# Patient Record
Sex: Male | Born: 1955 | Race: White | Hispanic: No | Smoking: Current every day smoker
Health system: Southern US, Community
[De-identification: ages and names within clinical notes are randomized; demographics above are authoritative.]

## PROBLEM LIST (undated history)

## (undated) DIAGNOSIS — J449 Chronic obstructive pulmonary disease, unspecified: Secondary | ICD-10-CM

## (undated) DIAGNOSIS — Z972 Presence of dental prosthetic device (complete) (partial): Secondary | ICD-10-CM

## (undated) DIAGNOSIS — W3400XA Accidental discharge from unspecified firearms or gun, initial encounter: Secondary | ICD-10-CM

## (undated) HISTORY — DX: Chronic obstructive pulmonary disease, unspecified: J44.9

## (undated) HISTORY — PX: SKIN GRAFT: SHX250

## (undated) HISTORY — DX: Accidental discharge from unspecified firearms or gun, initial encounter: W34.00XA

## (undated) HISTORY — PX: BACK SURGERY: SHX140

---

## 2008-12-17 ENCOUNTER — Ambulatory Visit: Payer: Self-pay | Admitting: Physician Assistant

## 2008-12-24 ENCOUNTER — Ambulatory Visit: Payer: Self-pay | Admitting: Unknown Physician Specialty

## 2008-12-27 ENCOUNTER — Ambulatory Visit: Payer: Self-pay | Admitting: Unknown Physician Specialty

## 2015-12-21 DIAGNOSIS — J449 Chronic obstructive pulmonary disease, unspecified: Secondary | ICD-10-CM | POA: Diagnosis not present

## 2015-12-22 ENCOUNTER — Encounter: Payer: Self-pay | Admitting: *Deleted

## 2015-12-28 ENCOUNTER — Ambulatory Visit (INDEPENDENT_AMBULATORY_CARE_PROVIDER_SITE_OTHER): Payer: PPO | Admitting: General Surgery

## 2015-12-28 ENCOUNTER — Encounter: Payer: Self-pay | Admitting: General Surgery

## 2015-12-28 VITALS — BP 150/94 | HR 74 | Resp 12 | Ht 66.0 in | Wt 164.0 lb

## 2015-12-28 DIAGNOSIS — K4090 Unilateral inguinal hernia, without obstruction or gangrene, not specified as recurrent: Secondary | ICD-10-CM | POA: Diagnosis not present
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
## 2015-12-28 NOTE — Patient Instructions (Addendum)
The patient is aware to call back for any questions or concerns. Inguinal Hernia, Adult Muscles help keep everything in the body in its proper place. But if a weak spot in the muscles develops, something can poke through. That is called a hernia. When this happens in the lower part of the belly (abdomen), it is called an inguinal hernia. (It takes its name from a part of the body in this region called the inguinal canal.) A weak spot in the wall of muscles lets some fat or part of the small intestine bulge through. An inguinal hernia can develop at any age. Men get them more often than women. CAUSES  In adults, an inguinal hernia develops over time.  It can be triggered by:  Suddenly straining the muscles of the lower abdomen.  Lifting heavy objects.  Straining to have a bowel movement. Difficult bowel movements (constipation) can lead to this.  Constant coughing. This may be caused by smoking or lung disease.  Being overweight.  Being pregnant.  Working at a job that requires long periods of standing or heavy lifting.  Having had an inguinal hernia before. One type can be an emergency situation. It is called a strangulated inguinal hernia. It develops if part of the small intestine slips through the weak spot and cannot get back into the abdomen. The blood supply can be cut off. If that happens, part of the intestine may die. This situation requires emergency surgery. SYMPTOMS  Often, a small inguinal hernia has no symptoms. It is found when a healthcare provider does a physical exam. Larger hernias usually have symptoms.   In adults, symptoms may include:  A lump in the groin. This is easier to see when the person is standing. It might disappear when lying down.  In men, a lump in the scrotum.  Pain or burning in the groin. This occurs especially when lifting, straining or coughing.  A dull ache or feeling of pressure in the groin.  Signs of a strangulated hernia can  include:  A bulge in the groin that becomes very painful and tender to the touch.  A bulge that turns red or purple.  Fever, nausea and vomiting.  Inability to have a bowel movement or to pass gas. DIAGNOSIS  To decide if you have an inguinal hernia, a healthcare provider will probably do a physical examination.  This will include asking questions about any symptoms you have noticed.  The healthcare provider might feel the groin area and ask you to cough. If an inguinal hernia is felt, the healthcare provider may try to slide it back into the abdomen.  Usually no other tests are needed. TREATMENT  Treatments can vary. The size of the hernia makes a difference. Options include:  Watchful waiting. This is often suggested if the hernia is small and you have had no symptoms.  No medical procedure will be done unless symptoms develop.  You will need to watch closely for symptoms. If any occur, contact your healthcare provider right away.  Surgery. This is used if the hernia is larger or you have symptoms.  Open surgery. This is usually an outpatient procedure (you will not stay overnight in a hospital). An cut (incision) is made through the skin in the groin. The hernia is put back inside the abdomen. The weak area in the muscles is then repaired by herniorrhaphy or hernioplasty. Herniorrhaphy: in this type of surgery, the weak muscles are sewn back together. Hernioplasty: a patch or mesh is   used to close the weak area in the abdominal wall.  Laparoscopy. In this procedure, a surgeon makes small incisions. A thin tube with a tiny video camera (called a laparoscope) is put into the abdomen. The surgeon repairs the hernia with mesh by looking with the video camera and using two long instruments. HOME CARE INSTRUCTIONS   After surgery to repair an inguinal hernia:  You will need to take pain medicine prescribed by your healthcare provider. Follow all directions carefully.  You will need  to take care of the wound from the incision.  Your activity will be restricted for awhile. This will probably include no heavy lifting for several weeks. You also should not do anything too active for a few weeks. When you can return to work will depend on the type of job that you have.  During "watchful waiting" periods, you should:  Maintain a healthy weight.  Eat a diet high in fiber (fruits, vegetables and whole grains).  Drink plenty of fluids to avoid constipation. This means drinking enough water and other liquids to keep your urine clear or pale yellow.  Do not lift heavy objects.  Do not stand for long periods of time.  Quit smoking. This should keep you from developing a frequent cough. SEEK MEDICAL CARE IF:   A bulge develops in your groin area.  You feel pain, a burning sensation or pressure in the groin. This might be worse if you are lifting or straining.  You develop a fever of more than 100.5 F (38.1 C). SEEK IMMEDIATE MEDICAL CARE IF:   Pain in the groin increases suddenly.  A bulge in the groin gets bigger suddenly and does not go down.  For men, there is sudden pain in the scrotum. Or, the size of the scrotum increases.  A bulge in the groin area becomes red or purple and is painful to touch.  You have nausea or vomiting that does not go away.  You feel your heart beating much faster than normal.  You cannot have a bowel movement or pass gas.  You develop a fever of more than 102.0 F (38.9 C).   This information is not intended to replace advice given to you by your health care provider. Make sure you discuss any questions you have with your health care provider.   Document Released: 03/03/2009 Document Revised: 01/07/2012 Document Reviewed: 04/18/2015 Elsevier Interactive Patient Education 2016 [employer].  Patient's surgery has been scheduled for 01-23-16 at [facility].

## 2015-12-29 ENCOUNTER — Encounter: Payer: Self-pay | Admitting: General Surgery

## 2016-01-16 ENCOUNTER — Inpatient Hospital Stay: Admission: RE | Admit: 2016-01-16 | Payer: Self-pay | Source: Ambulatory Visit

## 2016-01-16 ENCOUNTER — Encounter: Payer: Self-pay | Admitting: *Deleted

## 2016-01-16 NOTE — Patient Instructions (Signed)
  Your procedure is scheduled on: 01/23/16 Report to Day Surgery. [facility] SECOND FLOOR To find out your arrival time please call [phone] between 1PM - 3PM on 01/20/16  Remember: Instructions that are not followed completely may result in serious medical risk, up to and including death, or upon the discretion of your surgeon and anesthesiologist your surgery may need to be rescheduled.    _X___ 1. Do not eat food or drink liquids after midnight. No gum chewing or hard candies.     __X__ 2. No Alcohol for 24 hours before or after surgery.   ____ 3. Bring all medications with you on the day of surgery if instructed.    _X___ 4. Notify your doctor if there is any change in your medical condition     (cold, fever, infections).     Do not wear jewelry, make-up, hairpins, clips or nail polish.  Do not wear lotions, powders, or perfumes. You may wear deodorant.  Do not shave 48 hours prior to surgery. Men may shave face and neck.  Do not bring valuables to the hospital.    [facility] is not responsible for any belongings or valuables.               Contacts, dentures or bridgework may not be worn into surgery.  Leave your suitcase in the car. After surgery it may be brought to your room.  For patients admitted to the hospital, discharge time is determined by your                treatment team.   Patients discharged the day of surgery will not be allowed to drive home.   Please read over the following fact sheets that you were given:   Surgical Site Infection Prevention   ____ Take these medicines the morning of surgery with A SIP OF WATER:    1.  2.   3.   4.  5.  6.  ____ Fleet Enema (as directed)   ____ Use CHG Soap as directed  _X_ Use inhalers on the day of surgery  ____ Stop metformin 2 days prior to surgery    ____ Take 1/2 of usual insulin dose the night before surgery and none on the morning of surgery.   ____ Stop Coumadin/Plavix/aspirin on   _X___ Stop  Anti-inflammatories on   USE TYLENOL INSTREAD OG ADVIL UNTIL AFTER SURGERY  ____ Stop supplements until after surgery.    ____ Bring C-Pap to the hospital.

## 2016-01-23 ENCOUNTER — Ambulatory Visit
Admission: RE | Admit: 2016-01-23 | Discharge: 2016-01-23 | Disposition: A | Discharge status: Home / Self Care | Payer: PPO | Source: Ambulatory Visit | Attending: General Surgery | Admitting: General Surgery

## 2016-01-23 ENCOUNTER — Encounter
Admission: RE | Disposition: A | Discharge status: Home / Self Care | Payer: Self-pay | Source: Ambulatory Visit | Attending: General Surgery

## 2016-01-23 ENCOUNTER — Ambulatory Visit: Payer: PPO | Admitting: Anesthesiology

## 2016-01-23 ENCOUNTER — Encounter: Payer: Self-pay | Admitting: *Deleted

## 2016-01-23 DIAGNOSIS — K4090 Unilateral inguinal hernia, without obstruction or gangrene, not specified as recurrent: Secondary | ICD-10-CM | POA: Insufficient documentation

## 2016-01-23 DIAGNOSIS — J449 Chronic obstructive pulmonary disease, unspecified: Secondary | ICD-10-CM | POA: Insufficient documentation

## 2016-01-23 DIAGNOSIS — Z801 Family history of malignant neoplasm of trachea, bronchus and lung: Secondary | ICD-10-CM | POA: Diagnosis not present

## 2016-01-23 DIAGNOSIS — R05 Cough: Secondary | ICD-10-CM | POA: Insufficient documentation

## 2016-01-23 DIAGNOSIS — F17210 Nicotine dependence, cigarettes, uncomplicated: Secondary | ICD-10-CM | POA: Diagnosis not present

## 2016-01-23 DIAGNOSIS — Z7951 Long term (current) use of inhaled steroids: Secondary | ICD-10-CM | POA: Diagnosis not present

## 2016-01-23 DIAGNOSIS — Z800 Family history of malignant neoplasm of digestive organs: Secondary | ICD-10-CM | POA: Diagnosis not present

## 2016-01-23 HISTORY — PX: INGUINAL HERNIA REPAIR: SHX194

## 2016-01-23 SURGERY — REPAIR, HERNIA, INGUINAL, ADULT
Anesthesia: General | Laterality: Left | Wound class: Clean

## 2016-01-23 MED ORDER — ACETAMINOPHEN 10 MG/ML IV SOLN
INTRAVENOUS | Status: DC | PRN
  Administered 2016-01-23: 1000 mg via INTRAVENOUS

## 2016-01-23 MED ORDER — BUPIVACAINE HCL (PF) 0.5 % IJ SOLN
INTRAMUSCULAR | Status: AC
  Filled 2016-01-23: qty 30

## 2016-01-23 MED ORDER — CHLORHEXIDINE GLUCONATE 4 % EX LIQD: 1.0000 "application " | Freq: Once | CUTANEOUS | Status: DC

## 2016-01-23 MED ORDER — LIDOCAINE HCL 1 % IJ SOLN
INTRAMUSCULAR | Status: DC | PRN
  Administered 2016-01-23: 30 mL via SUBCUTANEOUS

## 2016-01-23 MED ORDER — PROPOFOL 10 MG/ML IV BOLUS
INTRAVENOUS | Status: DC | PRN
  Administered 2016-01-23: 20 mg via INTRAVENOUS

## 2016-01-23 MED ORDER — PROPOFOL 500 MG/50ML IV EMUL
INTRAVENOUS | Status: DC | PRN
  Administered 2016-01-23: 100 ug/kg/min via INTRAVENOUS

## 2016-01-23 MED ORDER — HYDROCODONE-ACETAMINOPHEN 5-325 MG PO TABS: 1.0000 | ORAL_TABLET | Freq: Four times a day (QID) | ORAL | Status: DC | PRN

## 2016-01-23 MED ORDER — ONDANSETRON HCL 4 MG/2ML IJ SOLN: 4.0000 mg | Freq: Once | INTRAMUSCULAR | Status: DC | PRN

## 2016-01-23 MED ORDER — MIDAZOLAM HCL 2 MG/2ML IJ SOLN
INTRAMUSCULAR | Status: DC | PRN
Start: 2016-01-23 — End: 2016-01-23
  Administered 2016-01-23 (×2): 2 mg via INTRAVENOUS

## 2016-01-23 MED ORDER — CEFAZOLIN SODIUM-DEXTROSE 2-3 GM-% IV SOLR
INTRAVENOUS | Status: AC
  Administered 2016-01-23: 2000 mg
  Filled 2016-01-23: qty 50

## 2016-01-23 MED ORDER — LIDOCAINE HCL (PF) 1 % IJ SOLN
INTRAMUSCULAR | Status: AC
  Filled 2016-01-23: qty 30

## 2016-01-23 MED ORDER — ONDANSETRON HCL 4 MG/2ML IJ SOLN
INTRAMUSCULAR | Status: DC | PRN
Start: 2016-01-23 — End: 2016-01-23
  Administered 2016-01-23: 4 mg via INTRAVENOUS

## 2016-01-23 MED ORDER — LACTATED RINGERS IV SOLN
INTRAVENOUS | Status: DC
  Administered 2016-01-23: 06:00:00 via INTRAVENOUS

## 2016-01-23 MED ORDER — FAMOTIDINE 20 MG PO TABS
20.0000 mg | ORAL_TABLET | Freq: Once | ORAL | Status: AC
  Administered 2016-01-23: 20 mg via ORAL

## 2016-01-23 MED ORDER — OXYCODONE-ACETAMINOPHEN 5-325 MG PO TABS: 1.0000 | ORAL_TABLET | ORAL | Status: DC | PRN

## 2016-01-23 MED ORDER — CEFAZOLIN SODIUM-DEXTROSE 2-4 GM/100ML-% IV SOLN
2.0000 g | INTRAVENOUS | Status: DC
  Filled 2016-01-23: qty 100

## 2016-01-23 MED ORDER — FENTANYL CITRATE (PF) 100 MCG/2ML IJ SOLN
INTRAMUSCULAR | Status: DC | PRN
  Administered 2016-01-23 (×2): 50 ug via INTRAVENOUS

## 2016-01-23 MED ORDER — ACETAMINOPHEN 10 MG/ML IV SOLN
INTRAVENOUS | Status: AC
  Filled 2016-01-23: qty 100

## 2016-01-23 MED ORDER — FAMOTIDINE 20 MG PO TABS
ORAL_TABLET | ORAL | Status: AC
  Administered 2016-01-23: 20 mg via ORAL
  Filled 2016-01-23: qty 1

## 2016-01-23 MED ORDER — FENTANYL CITRATE (PF) 100 MCG/2ML IJ SOLN: 25.0000 ug | INTRAMUSCULAR | Status: DC | PRN

## 2016-01-23 SURGICAL SUPPLY — 29 items
BLADE SURG 15 STRL SS SAFETY (BLADE) ×3 IMPLANT
CANISTER SUCT 1200ML W/VALVE (MISCELLANEOUS) ×3 IMPLANT
CHLORAPREP W/TINT 26ML (MISCELLANEOUS) ×3 IMPLANT
DECANTER SPIKE VIAL GLASS SM (MISCELLANEOUS) IMPLANT
DRAIN PENROSE 1/4X12 LTX (DRAIN) ×3 IMPLANT
DRAPE LAPAROTOMY 100X77 ABD (DRAPES) ×3 IMPLANT
ELECT REM PT RETURN 9FT ADLT (ELECTROSURGICAL) ×3
ELECTRODE REM PT RTRN 9FT ADLT (ELECTROSURGICAL) ×1 IMPLANT
GLOVE BIO SURGEON STRL SZ7 (GLOVE) ×9 IMPLANT
GOWN STRL REUS W/ TWL LRG LVL3 (GOWN DISPOSABLE) ×2 IMPLANT
GOWN STRL REUS W/TWL LRG LVL3 (GOWN DISPOSABLE) ×4
KIT RM TURNOVER STRD PROC AR (KITS) ×3 IMPLANT
LABEL OR SOLS (LABEL) ×3 IMPLANT
LIQUID BAND (GAUZE/BANDAGES/DRESSINGS) ×3 IMPLANT
MESH PARIETEX PROGRIP LEFT (Mesh General) ×3 IMPLANT
NDL HPO THNWL 1X22GA REG BVL (NEEDLE) ×1 IMPLANT
NEEDLE HYPO 25X1 1.5 SAFETY (NEEDLE) ×3 IMPLANT
NEEDLE SAFETY 22GX1 (NEEDLE) ×2
NS IRRIG 500ML POUR BTL (IV SOLUTION) ×3 IMPLANT
PACK BASIN MINOR ARMC (MISCELLANEOUS) ×3 IMPLANT
SUT PDS 2-0 27IN (SUTURE) ×3 IMPLANT
SUT VIC AB 2-0 SH 27 (SUTURE) ×2
SUT VIC AB 2-0 SH 27XBRD (SUTURE) ×1 IMPLANT
SUT VIC AB 3-0 54X BRD REEL (SUTURE) ×1 IMPLANT
SUT VIC AB 3-0 BRD 54 (SUTURE) ×2
SUT VIC AB 3-0 SH 27 (SUTURE) ×2
SUT VIC AB 3-0 SH 27X BRD (SUTURE) ×1 IMPLANT
SUT VIC AB 4-0 FS2 27 (SUTURE) ×3 IMPLANT
SYR CONTROL 10ML (SYRINGE) ×3 IMPLANT

## 2016-01-23 NOTE — Anesthesia Preprocedure Evaluation (Signed)
Anesthesia Evaluation  Patient identified by MRN, date of birth, ID band Patient awake    Reviewed: Allergy & Precautions, NPO status , Patient's Chart, lab work & pertinent test results  Airway Mallampati: III  TM Distance: >3 FB Neck ROM: Limited    Dental   Pulmonary COPD,  COPD inhaler, Current Smoker,    Pulmonary exam normal breath sounds clear to auscultation       Cardiovascular negative cardio ROS Normal cardiovascular exam     Neuro/Psych negative neurological ROS  negative psych ROS   GI/Hepatic negative GI ROS, Neg liver ROS,   Endo/Other  negative endocrine ROS  Renal/GU negative Renal ROS  negative genitourinary   Musculoskeletal negative musculoskeletal ROS (+)   Abdominal Normal abdominal exam  (+)   Peds negative pediatric ROS (+)  Hematology negative hematology ROS (+)   Anesthesia Other Findings   Reproductive/Obstetrics                             Anesthesia Physical Anesthesia Plan  ASA: III  Anesthesia Plan: General   Post-op Pain Management:    Induction: Intravenous  Airway Management Planned:   Additional Equipment:   Intra-op Plan:   Post-operative Plan:   Informed Consent: I have reviewed the patients History and Physical, chart, labs and discussed the procedure including the risks, benefits and alternatives for the proposed anesthesia with the patient or authorized representative who has indicated his/her understanding and acceptance.   Dental advisory given  Plan Discussed with: CRNA and Surgeon  Anesthesia Plan Comments:         Anesthesia Quick Evaluation
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
## 2016-01-23 NOTE — Interval H&P Note (Signed)
History and Physical Interval Note:  01/23/2016 7:07 AM  Daniel Larson  has presented today for surgery, with the diagnosis of LEFT INGUINAL HERNIA  The various methods of treatment have been discussed with the patient and family. After consideration of risks, benefits and other options for treatment, the patient has consented to  Procedure(s): HERNIA REPAIR INGUINAL ADULT (Left) as a surgical intervention .  The patient's history has been reviewed, patient examined, no change in status, stable for surgery.  I have reviewed the patient's chart and labs.  Questions were answered to the patient's satisfaction.     Gregory Dowe G

## 2016-01-23 NOTE — Op Note (Signed)
Preop diagnosis: Left inguinal hernia  Post op diagnosis: Same, direct type  Operation: Repair left inguinal hernia with mesh  Surgeon: S.G.Ezio Wieck  Assistant:     Anesthesia: Monitored anesthesia care. Local anesthetic containing 0.5% Marcaine and 1% Xylocaine total of 30 mL  Complications: None  EBL: Minimal  Drains: None  Description: Patient was placed the supine position the operating table. With adequate sedation and monitoring the left groin was prepped and draped sterile field and timeout performed. Local anesthetic was instilled along the inguinal canal area to obtain an adequate block. Skin incision made along the medial two thirds of the inguinal canal and deepened through the layers down to the external oblique. Bleeding was controlled with cautery. External oblique was opened along the line of its fibers. Exploration revealed the patient had a fairly sizable direct hernia lying medial to the cord which was fairly narrow. The hernia was freed from the cord in the posterior wall was well delineated. The ilioinguinal nerve was identified and preserved. The hernia was pushed back in easily and plicated with a running suture of 2-0 Vicryl. The repair was achieved with the use of Progrip mesh. The mesh was placed around the cord and laid down to cover the posterior wall completely and the lateral ends tucked underneath the external oblique. Medial and was tacked to the pubic tubercle with a single 2-0 PDS stitch. The external oblique was then closed with the running 2-0 PDS stitch. Subcutaneous tissue closed with 3-0 Vicryl. Skin closed with subcuticular 4-0 Vicryl and liqui  ban applied. Procedure was well-tolerated with no immediate problems encountered. Patient was returned recovery room stable condition

## 2016-01-23 NOTE — Transfer of Care (Signed)
Immediate Anesthesia Transfer of Care Note  Patient: Daniel Larson  Procedure(s) Performed: Procedure(s): HERNIA REPAIR INGUINAL ADULT (Left)  Patient Location: PACU  Anesthesia Type:General  Level of Consciousness: sedated  Airway & Oxygen Therapy: Patient Spontanous Breathing and Patient connected to face mask oxygen  Post-op Assessment: Report given to RN and Post -op Vital signs reviewed and stable  Post vital signs: Reviewed and stable  Last Vitals:  Filed Vitals:   01/23/16 0605 01/23/16 0824  BP: 129/77 106/64  Pulse: 83 70  Temp: 36.5 C 36.4 C  Resp: 16 11    Complications: No apparent anesthesia complications

## 2016-01-23 NOTE — Discharge Instructions (Signed)
Inguinal Hernia, Adult °Muscles help keep everything in the body in its proper place. But if a weak spot in the muscles develops, something can poke through. That is called a hernia. When this happens in the lower part of the belly (abdomen), it is called an inguinal hernia. (It takes its name from a part of the body in this region called the inguinal canal.) A weak spot in the wall of muscles lets some fat or part of the small intestine bulge through. An inguinal hernia can develop at any age. Men get them more often than women. °CAUSES  °In adults, an inguinal hernia develops over time. °· It can be triggered by: °¨ Suddenly straining the muscles of the lower abdomen. °¨ Lifting heavy objects. °¨ Straining to have a bowel movement. Difficult bowel movements (constipation) can lead to this. °¨ Constant coughing. This may be caused by smoking or lung disease. °¨ Being overweight. °¨ Being pregnant. °¨ Working at a job that requires long periods of standing or heavy lifting. °¨ Having had an inguinal hernia before. °One type can be an emergency situation. It is called a strangulated inguinal hernia. It develops if part of the small intestine slips through the weak spot and cannot get back into the abdomen. The blood supply can be cut off. If that happens, part of the intestine may die. This situation requires emergency surgery. °SYMPTOMS  °Often, a small inguinal hernia has no symptoms. It is found when a healthcare provider does a physical exam. Larger hernias usually have symptoms.  °· In adults, symptoms may include: °¨ A lump in the groin. This is easier to see when the person is standing. It might disappear when lying down. °¨ In men, a lump in the scrotum. °¨ Pain or burning in the groin. This occurs especially when lifting, straining or coughing. °¨ A dull ache or feeling of pressure in the groin. °· Signs of a strangulated hernia can include: °¨ A bulge in the groin that becomes very painful and tender to the  touch. °¨ A bulge that turns red or purple. °¨ Fever, nausea and vomiting. °¨ Inability to have a bowel movement or to pass gas. °DIAGNOSIS  °To decide if you have an inguinal hernia, a healthcare provider will probably do a physical examination. °· This will include asking questions about any symptoms you have noticed. °· The healthcare provider might feel the groin area and ask you to cough. If an inguinal hernia is felt, the healthcare provider may try to slide it back into the abdomen. °· Usually no other tests are needed. °TREATMENT  °Treatments can vary. The size of the hernia makes a difference. Options include: °· Watchful waiting. This is often suggested if the hernia is small and you have had no symptoms. °¨ No medical procedure will be done unless symptoms develop. °¨ You will need to watch closely for symptoms. If any occur, contact your healthcare provider right away. °· Surgery. This is used if the hernia is larger or you have symptoms. °¨ Open surgery. This is usually an outpatient procedure (you will not stay overnight in a hospital). An cut (incision) is made through the skin in the groin. The hernia is put back inside the abdomen. The weak area in the muscles is then repaired by herniorrhaphy or hernioplasty. Herniorrhaphy: in this type of surgery, the weak muscles are sewn back together. Hernioplasty: a patch or mesh is used to close the weak area in the abdominal wall. °¨ Laparoscopy.   In this procedure, a surgeon makes small incisions. A thin tube with a tiny video camera (called a laparoscope) is put into the abdomen. The surgeon repairs the hernia with mesh by looking with the video camera and using two long instruments. HOME CARE INSTRUCTIONS   After surgery to repair an inguinal hernia:  You will need to take pain medicine prescribed by your healthcare provider. Follow all directions carefully.  You will need to take care of the wound from the incision.  Your activity will be  restricted for awhile. This will probably include no heavy lifting for several weeks. You also should not do anything too active for a few weeks. When you can return to work will depend on the type of job that you have.  During "watchful waiting" periods, you should:  Maintain a healthy weight.  Eat a diet high in fiber (fruits, vegetables and whole grains).  Drink plenty of fluids to avoid constipation. This means drinking enough water and other liquids to keep your urine clear or pale yellow.  Do not lift heavy objects.  Do not stand for long periods of time.  Quit smoking. This should keep you from developing a frequent cough. SEEK MEDICAL CARE IF:   A bulge develops in your groin area.  You feel pain, a burning sensation or pressure in the groin. This might be worse if you are lifting or straining.  You develop a fever of more than 100.5 F (38.1 C). SEEK IMMEDIATE MEDICAL CARE IF:   Pain in the groin increases suddenly.  A bulge in the groin gets bigger suddenly and does not go down.  For men, there is sudden pain in the scrotum. Or, the size of the scrotum increases.  A bulge in the groin area becomes red or purple and is painful to touch.  You have nausea or vomiting that does not go away.  You feel your heart beating much faster than normal.  You cannot have a bowel movement or pass gas.  You develop a fever of more than 102.0 F (38.9 C).   This information is not intended to replace advice given to you by your health care provider. Make sure you discuss any questions you have with your health care provider.   Document Released: 03/03/2009 Document Revised: 01/07/2012 Document Reviewed: 04/18/2015 Elsevier Interactive Patient Education 2016 [place]   1) The drugs that you were given will stay in your system until tomorrow so for the next 24 hours you should not:  A) Drive an automobile B) Make any legal  decisions C) Drink any alcoholic beverage   2) You may resume regular meals tomorrow.  Today it is better to start with liquids and gradually work up to solid foods.  You may eat anything you prefer, but it is better to start with liquids, then soup and crackers, and gradually work up to solid foods.   3) Please notify your doctor immediately if you have any unusual bleeding, trouble breathing, redness and pain at the surgery site, drainage, fever, or pain not relieved by medication.    4) Additional Instructions:        Please contact your physician with any problems or Same Day Surgery at [phone], Monday through Friday 6 am to 4 pm, or [place] at [facility] number at [phone].

## 2016-01-24 NOTE — Anesthesia Postprocedure Evaluation (Signed)
Anesthesia Post Note  Patient: Daniel Larson  Procedure(s) Performed: Procedure(s) (LRB): HERNIA REPAIR INGUINAL ADULT (Left)  Patient location during evaluation: PACU Anesthesia Type: General Level of consciousness: awake and alert and oriented Pain management: pain level controlled Vital Signs Assessment: post-procedure vital signs reviewed and stable Respiratory status: spontaneous breathing Cardiovascular status: blood pressure returned to baseline Anesthetic complications: no    Last Vitals:  Filed Vitals:   01/23/16 0915 01/23/16 0950  BP: 126/84 128/86  Pulse: 72 74  Temp:    Resp: 18 18    Last Pain:  Filed Vitals:   01/23/16 0953  PainSc: Asleep                 Alexavier Tsutsui

## 2016-01-30 ENCOUNTER — Ambulatory Visit (INDEPENDENT_AMBULATORY_CARE_PROVIDER_SITE_OTHER): Payer: PPO | Admitting: General Surgery

## 2016-01-30 ENCOUNTER — Encounter: Payer: Self-pay | Admitting: General Surgery

## 2016-01-30 VITALS — BP 124/68 | HR 90 | Resp 14 | Ht 67.0 in | Wt 161.0 lb

## 2016-01-30 DIAGNOSIS — K4090 Unilateral inguinal hernia, without obstruction or gangrene, not specified as recurrent: Secondary | ICD-10-CM

## 2016-01-30 NOTE — Patient Instructions (Addendum)
Call with any concerns or questions. Return in 4 weeks, may start driving in one week.  Increase activity as tolerated.

## 2016-01-30 NOTE — Progress Notes (Signed)
Patient ID: Daniel Larson, male   DOB: Mar 22, 1956, 60 y.o.   MRN: [id number] Here today for a one week post op visit for left inguinal hernia  repair done on 01/23/16. Patient states he is doing well. He has some swelling but it is much better. He is moving his bowels regularly and urinating normal.  I have reviewed the history of present illness with the patient.  Well healed incision, no hernia noted. Abdomen is soft, good bowel sounds Advised to use heating pad to help with the pain and swelling.   Return in 4-5 weeks. Wait one week to start driving. Increase activity as tolerated.  This information has been scribed by Verlene Mayer, CMA   PCP: Dr. Dion Body

## 2016-02-01 ENCOUNTER — Encounter: Payer: Self-pay | Admitting: General Surgery

## 2016-02-27 ENCOUNTER — Encounter: Payer: Self-pay | Admitting: General Surgery

## 2016-02-27 ENCOUNTER — Ambulatory Visit (INDEPENDENT_AMBULATORY_CARE_PROVIDER_SITE_OTHER): Payer: PPO | Admitting: General Surgery

## 2016-02-27 VITALS — BP 130/70 | HR 76 | Resp 14 | Ht 67.0 in | Wt 167.0 lb

## 2016-02-27 DIAGNOSIS — K4090 Unilateral inguinal hernia, without obstruction or gangrene, not specified as recurrent: Secondary | ICD-10-CM

## 2016-02-27 NOTE — Patient Instructions (Signed)
Patient to return as needed. 

## 2016-02-27 NOTE — Progress Notes (Signed)
Here today for 6 week post op visit for left inguinal hernia repair done on 01/23/16. Patient states he is doing well. He has some swelling but it is much better. He is moving his bowels regularly and urinating normal.  I have reviewed the history of present illness with the patient.  Left inguinal incision is well healed. Repair is intact. Abdomen is soft. May return to normal activity and follow up as needed.  PCP:  Dion Body This information has been scribed by Gaspar Cola CMA.

## 2017-07-24 DIAGNOSIS — M9903 Segmental and somatic dysfunction of lumbar region: Secondary | ICD-10-CM | POA: Diagnosis not present

## 2017-07-24 DIAGNOSIS — G5721 Lesion of femoral nerve, right lower limb: Secondary | ICD-10-CM | POA: Diagnosis not present

## 2017-07-26 DIAGNOSIS — G5721 Lesion of femoral nerve, right lower limb: Secondary | ICD-10-CM | POA: Diagnosis not present

## 2017-07-26 DIAGNOSIS — M9903 Segmental and somatic dysfunction of lumbar region: Secondary | ICD-10-CM | POA: Diagnosis not present

## 2017-08-08 DIAGNOSIS — M47816 Spondylosis without myelopathy or radiculopathy, lumbar region: Secondary | ICD-10-CM | POA: Diagnosis not present

## 2017-08-08 DIAGNOSIS — M549 Dorsalgia, unspecified: Secondary | ICD-10-CM | POA: Diagnosis not present

## 2017-08-08 DIAGNOSIS — M5441 Lumbago with sciatica, right side: Secondary | ICD-10-CM | POA: Diagnosis not present

## 2017-08-08 DIAGNOSIS — G8929 Other chronic pain: Secondary | ICD-10-CM | POA: Diagnosis not present

## 2017-08-15 DIAGNOSIS — M2560 Stiffness of unspecified joint, not elsewhere classified: Secondary | ICD-10-CM | POA: Diagnosis not present

## 2017-08-15 DIAGNOSIS — G8929 Other chronic pain: Secondary | ICD-10-CM | POA: Diagnosis not present

## 2017-08-15 DIAGNOSIS — M5441 Lumbago with sciatica, right side: Secondary | ICD-10-CM | POA: Diagnosis not present

## 2017-08-15 DIAGNOSIS — M6281 Muscle weakness (generalized): Secondary | ICD-10-CM | POA: Diagnosis not present

## 2017-08-20 DIAGNOSIS — M5441 Lumbago with sciatica, right side: Secondary | ICD-10-CM | POA: Diagnosis not present

## 2017-08-20 DIAGNOSIS — G8929 Other chronic pain: Secondary | ICD-10-CM | POA: Diagnosis not present

## 2017-08-23 DIAGNOSIS — M5441 Lumbago with sciatica, right side: Secondary | ICD-10-CM | POA: Diagnosis not present

## 2017-08-23 DIAGNOSIS — G8929 Other chronic pain: Secondary | ICD-10-CM | POA: Diagnosis not present

## 2017-08-26 DIAGNOSIS — G8929 Other chronic pain: Secondary | ICD-10-CM | POA: Diagnosis not present

## 2017-08-26 DIAGNOSIS — M5441 Lumbago with sciatica, right side: Secondary | ICD-10-CM | POA: Diagnosis not present

## 2017-09-04 DIAGNOSIS — M5441 Lumbago with sciatica, right side: Secondary | ICD-10-CM | POA: Diagnosis not present

## 2017-09-04 DIAGNOSIS — G8929 Other chronic pain: Secondary | ICD-10-CM | POA: Diagnosis not present

## 2017-09-23 DIAGNOSIS — M5136 Other intervertebral disc degeneration, lumbar region: Secondary | ICD-10-CM | POA: Diagnosis not present

## 2017-09-23 DIAGNOSIS — M4726 Other spondylosis with radiculopathy, lumbar region: Secondary | ICD-10-CM | POA: Diagnosis not present

## 2020-04-20 DIAGNOSIS — L409 Psoriasis, unspecified: Secondary | ICD-10-CM | POA: Diagnosis not present

## 2020-04-20 DIAGNOSIS — M5441 Lumbago with sciatica, right side: Secondary | ICD-10-CM | POA: Diagnosis not present

## 2020-06-01 DIAGNOSIS — M47816 Spondylosis without myelopathy or radiculopathy, lumbar region: Secondary | ICD-10-CM | POA: Diagnosis not present

## 2020-06-01 DIAGNOSIS — G8929 Other chronic pain: Secondary | ICD-10-CM | POA: Diagnosis not present

## 2020-06-01 DIAGNOSIS — M5137 Other intervertebral disc degeneration, lumbosacral region: Secondary | ICD-10-CM | POA: Diagnosis not present

## 2020-06-01 DIAGNOSIS — M5416 Radiculopathy, lumbar region: Secondary | ICD-10-CM | POA: Diagnosis not present

## 2020-06-01 DIAGNOSIS — M5442 Lumbago with sciatica, left side: Secondary | ICD-10-CM | POA: Diagnosis not present

## 2020-06-01 DIAGNOSIS — M5441 Lumbago with sciatica, right side: Secondary | ICD-10-CM | POA: Diagnosis not present

## 2020-06-02 ENCOUNTER — Other Ambulatory Visit: Payer: Self-pay | Admitting: Sports Medicine

## 2020-06-02 DIAGNOSIS — M47816 Spondylosis without myelopathy or radiculopathy, lumbar region: Secondary | ICD-10-CM

## 2020-06-02 DIAGNOSIS — M5441 Lumbago with sciatica, right side: Secondary | ICD-10-CM

## 2020-06-02 DIAGNOSIS — M5136 Other intervertebral disc degeneration, lumbar region: Secondary | ICD-10-CM

## 2020-06-02 DIAGNOSIS — M5416 Radiculopathy, lumbar region: Secondary | ICD-10-CM

## 2020-06-20 ENCOUNTER — Ambulatory Visit
Admission: RE | Admit: 2020-06-20 | Discharge: 2020-06-20 | Disposition: A | Discharge status: Home / Self Care | Payer: PPO | Source: Ambulatory Visit | Attending: Sports Medicine | Admitting: Sports Medicine

## 2020-06-20 ENCOUNTER — Other Ambulatory Visit: Payer: Self-pay

## 2020-06-20 DIAGNOSIS — M5136 Other intervertebral disc degeneration, lumbar region: Secondary | ICD-10-CM | POA: Insufficient documentation

## 2020-06-20 DIAGNOSIS — M5441 Lumbago with sciatica, right side: Secondary | ICD-10-CM | POA: Insufficient documentation

## 2020-06-20 DIAGNOSIS — M47816 Spondylosis without myelopathy or radiculopathy, lumbar region: Secondary | ICD-10-CM | POA: Insufficient documentation

## 2020-06-20 DIAGNOSIS — G8929 Other chronic pain: Secondary | ICD-10-CM | POA: Diagnosis not present

## 2020-06-20 DIAGNOSIS — M5416 Radiculopathy, lumbar region: Secondary | ICD-10-CM | POA: Insufficient documentation

## 2020-06-20 DIAGNOSIS — M4807 Spinal stenosis, lumbosacral region: Secondary | ICD-10-CM | POA: Diagnosis not present

## 2020-06-20 DIAGNOSIS — M5126 Other intervertebral disc displacement, lumbar region: Secondary | ICD-10-CM | POA: Diagnosis not present

## 2020-06-20 DIAGNOSIS — M5442 Lumbago with sciatica, left side: Secondary | ICD-10-CM

## 2020-06-20 DIAGNOSIS — M5125 Other intervertebral disc displacement, thoracolumbar region: Secondary | ICD-10-CM | POA: Diagnosis not present

## 2020-06-20 DIAGNOSIS — M48061 Spinal stenosis, lumbar region without neurogenic claudication: Secondary | ICD-10-CM | POA: Diagnosis not present

## 2020-06-23 DIAGNOSIS — M5416 Radiculopathy, lumbar region: Secondary | ICD-10-CM | POA: Diagnosis not present

## 2020-06-23 DIAGNOSIS — M5126 Other intervertebral disc displacement, lumbar region: Secondary | ICD-10-CM | POA: Diagnosis not present

## 2020-06-23 DIAGNOSIS — M5136 Other intervertebral disc degeneration, lumbar region: Secondary | ICD-10-CM | POA: Diagnosis not present

## 2020-06-23 DIAGNOSIS — M48062 Spinal stenosis, lumbar region with neurogenic claudication: Secondary | ICD-10-CM | POA: Diagnosis not present

## 2020-06-28 DIAGNOSIS — M5136 Other intervertebral disc degeneration, lumbar region: Secondary | ICD-10-CM | POA: Diagnosis not present

## 2020-06-28 DIAGNOSIS — M5416 Radiculopathy, lumbar region: Secondary | ICD-10-CM | POA: Diagnosis not present

## 2020-06-28 DIAGNOSIS — M5126 Other intervertebral disc displacement, lumbar region: Secondary | ICD-10-CM | POA: Diagnosis not present

## 2020-06-28 DIAGNOSIS — M48062 Spinal stenosis, lumbar region with neurogenic claudication: Secondary | ICD-10-CM | POA: Diagnosis not present

## 2020-07-25 DIAGNOSIS — M48062 Spinal stenosis, lumbar region with neurogenic claudication: Secondary | ICD-10-CM | POA: Diagnosis not present

## 2020-07-25 DIAGNOSIS — M5416 Radiculopathy, lumbar region: Secondary | ICD-10-CM | POA: Diagnosis not present

## 2020-07-25 DIAGNOSIS — M5126 Other intervertebral disc displacement, lumbar region: Secondary | ICD-10-CM | POA: Diagnosis not present

## 2020-08-29 DIAGNOSIS — M48062 Spinal stenosis, lumbar region with neurogenic claudication: Secondary | ICD-10-CM | POA: Diagnosis not present

## 2020-08-29 DIAGNOSIS — M5136 Other intervertebral disc degeneration, lumbar region: Secondary | ICD-10-CM | POA: Diagnosis not present

## 2020-08-29 DIAGNOSIS — M5416 Radiculopathy, lumbar region: Secondary | ICD-10-CM | POA: Diagnosis not present

## 2020-08-29 DIAGNOSIS — M5126 Other intervertebral disc displacement, lumbar region: Secondary | ICD-10-CM | POA: Diagnosis not present

## 2020-12-09 IMAGING — MR MR LUMBAR SPINE W/O CM
5 series · 30 of 48 positions shown · non-contrast
Comparison: None.

CLINICAL DATA: Bilateral hip pain.  Prior back surgery.

EXAM:
MRI LUMBAR SPINE WITHOUT CONTRAST
TECHNIQUE: Multiplanar, multisequence MR imaging of the lumbar spine was
performed. No intravenous contrast was administered.

[Series 5: T2 · sagittal · 4.0mm · 0.81mm/px · 6 of 17 slices shown (1 of 2)]
[im 1/17]
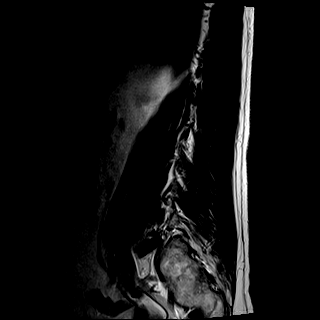
[im 4/17]
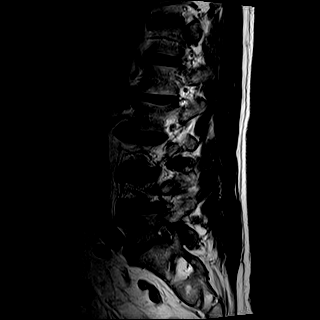
[im 7/17]
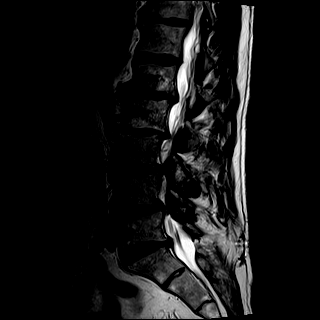
[im 10/17]
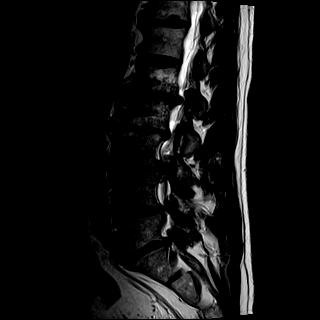
[im 13/17]
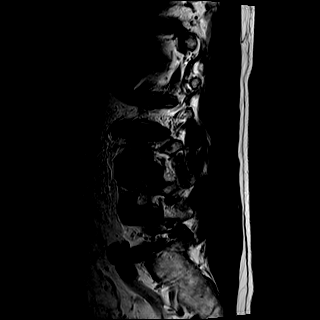
[im 17/17]
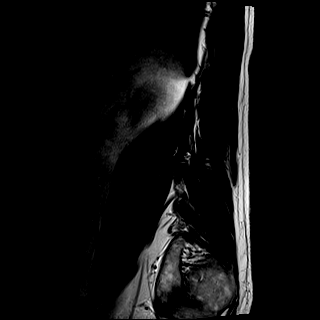

[Series 6: T1 · sagittal · 4.0mm · 0.81mm/px · 7 of 17 slices shown (1 of 2)]
[im 1/17]
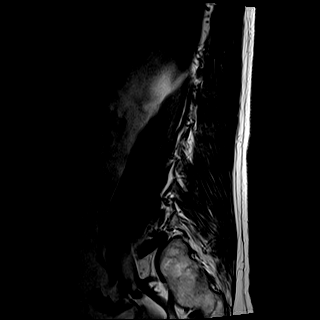
[im 3/17]
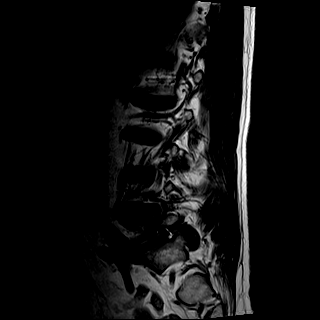
[im 6/17]
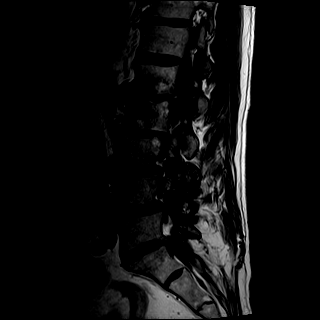
[im 9/17]
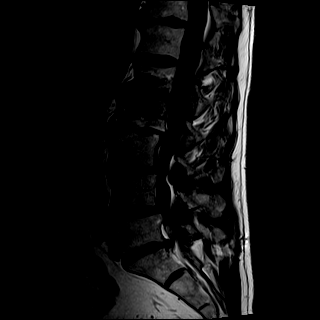
[im 11/17]
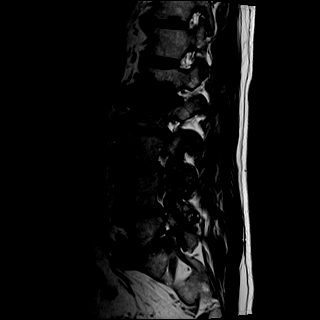
[im 14/17]
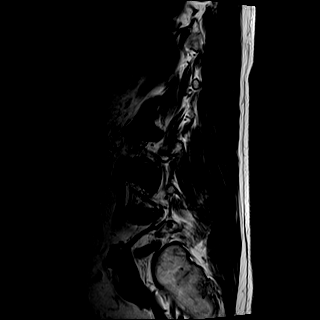
[im 17/17]
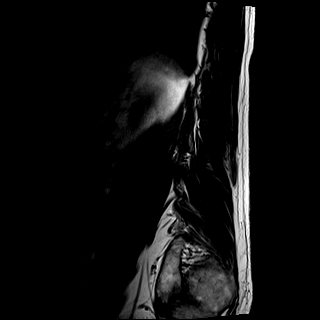

[Series 7: STIR · sagittal · 4.0mm · 0.41mm/px · 1 of 17 slices shown]
[im 1/17]
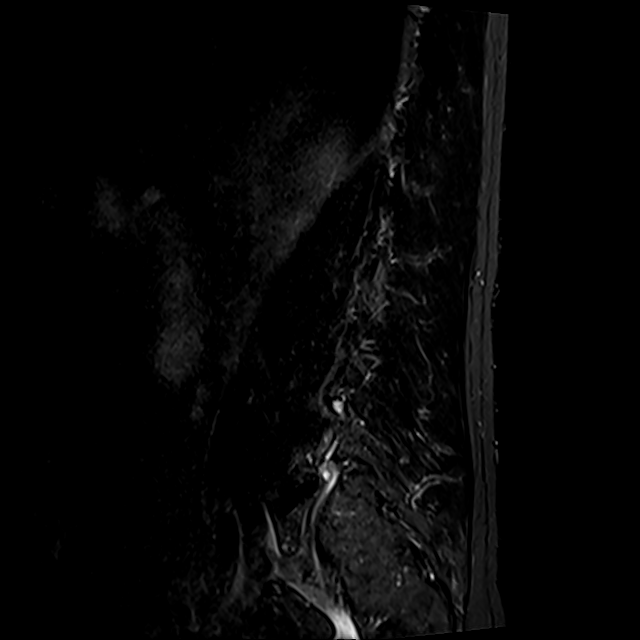

[Series 8: T2 · axial · 4.0mm · 0.78mm/px · z∈[-64,+125]mm · 8 of 34 slices shown (2 of 2)]
[im 1/34]
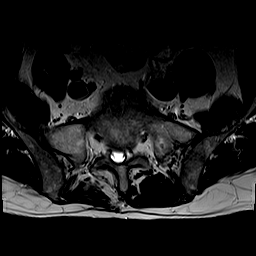
[im 6/34]
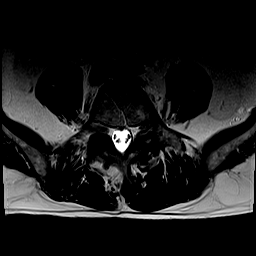
[im 11/34]
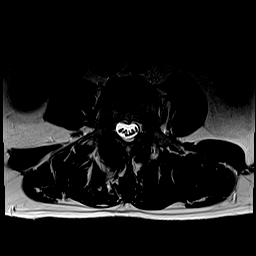
[im 16/34]
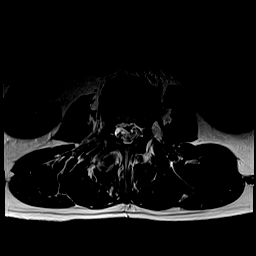
[im 18/34]
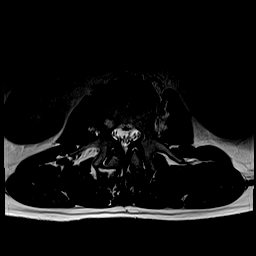
[im 23/34]
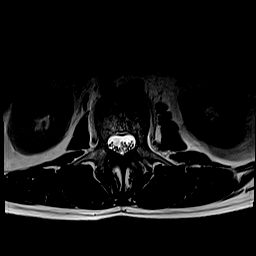
[im 28/34]
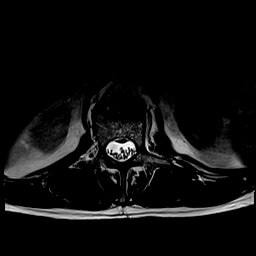
[im 34/34]
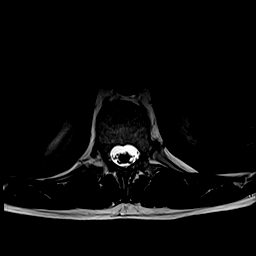

[Series 9: T1 · axial · 4.0mm · 0.39mm/px · z∈[-64,+125]mm · 8 of 34 slices shown (2 of 2)]
[im 1/34]
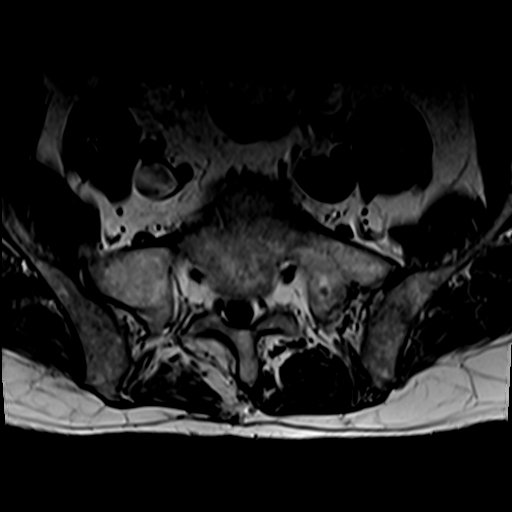
[im 6/34]
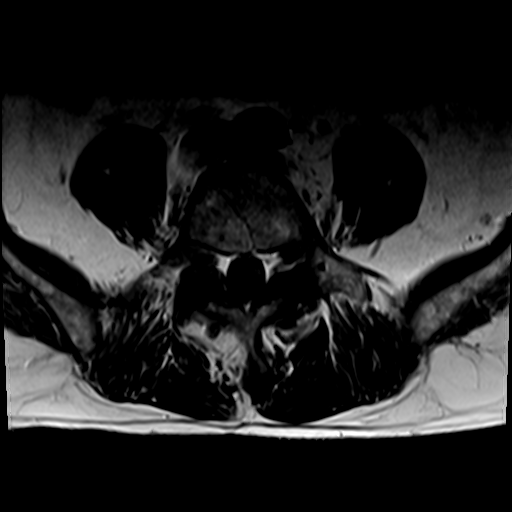
[im 11/34]
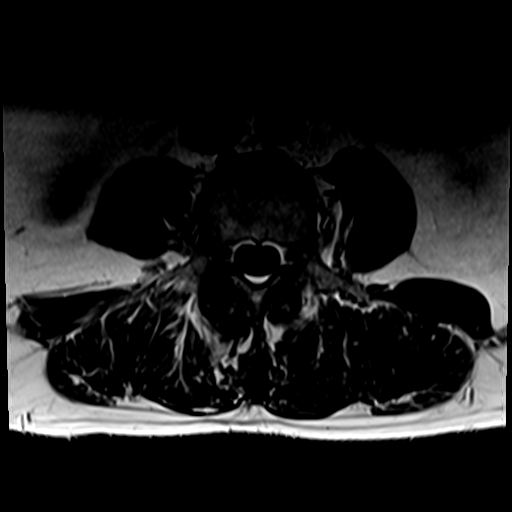
[im 16/34]
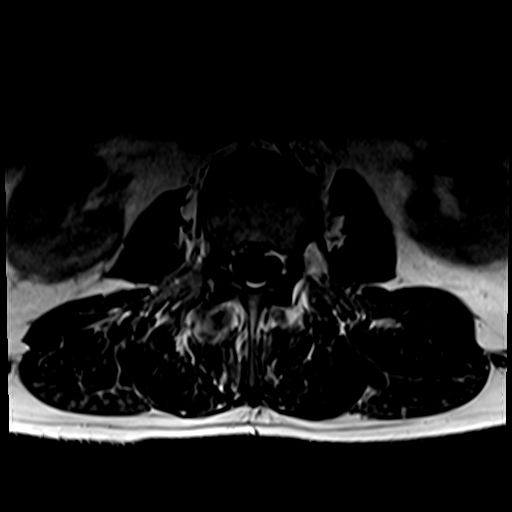
[im 18/34]
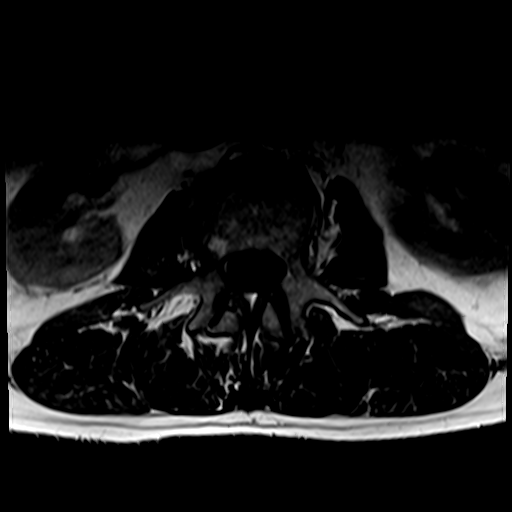
[im 23/34]
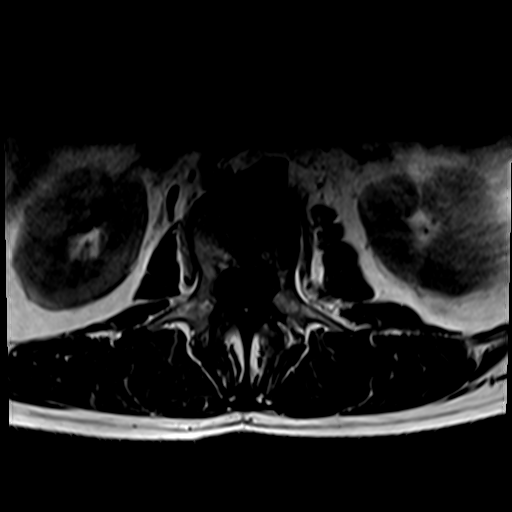
[im 28/34]
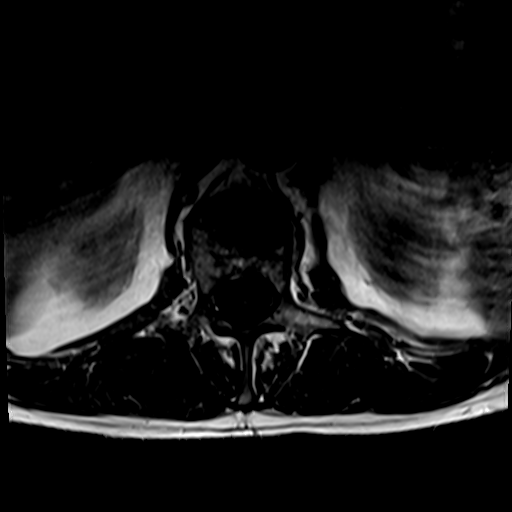
[im 34/34]
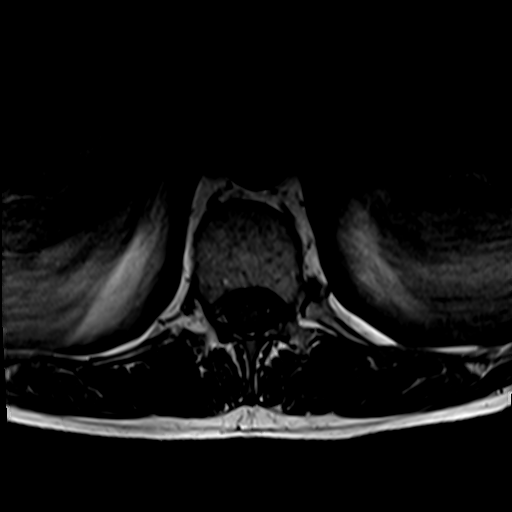

[30 of 48 positions shown; findings below may reference images not displayed]

FINDINGS: Segmentation:  Normal

Alignment:  Mild dextroscoliosis

Mild retrolisthesis L1-2 and L2-3.  Mild anterolisthesis L3-4 L4-5.

Vertebrae: Negative for fracture or mass. Discogenic edema and
sclerosis on the left at L1-2.

Conus medullaris and cauda equina: Conus extends to the L1-2 level.
Conus and cauda equina appear normal.

Paraspinal and other soft tissues: Negative for paraspinous mass or
adenopathy.

Disc levels:

T12-L1: Small central disc protrusion with extruded disc fragment
extending caudally behind the L1 vertebral body. Negative for
stenosis

L1-2: Severe disc degeneration asymmetric on the left at L1-2.
Diffuse endplate spurring left greater than right. Mild to moderate
spinal stenosis. Moderate subarticular stenosis on the left.

L2-3: Disc degeneration with disc bulging and diffuse endplate
spurring. Small central disc protrusion. Mild spinal stenosis and
mild subarticular stenosis bilaterally.

L3-4: Moderately large central disc protrusion. Diffuse bulging of
the disc. Severe facet and ligamentum flavum hypertrophy. Severe
spinal stenosis and moderate to severe subarticular stenosis
bilaterally.

L4-5: Diffuse disc bulging. Severe facet and ligamentum flavum
hypertrophy. Severe spinal stenosis and moderate to severe
subarticular stenosis bilaterally.

L5-S1: Right laminectomy. Diffuse endplate spurring with moderate
subarticular and foraminal stenosis bilaterally. Spinal canal
adequate in size. Thickened nerve roots suggesting chronic
arachnoiditis in the thecal sac.
IMPRESSION: 1. Small central disc protrusion T12-L1 with extruded disc fragment
caudally
2. Severe disc degeneration and spurring on the left at L1-2. Mild
to moderate spinal stenosis and moderate subarticular stenosis on
the left.
3. Mild spinal stenosis L2-3 with mild subarticular stenosis
bilaterally
4. Severe spinal stenosis L3-4 with moderate to severe subarticular
stenosis bilaterally
5. Severe spinal stenosis L4-5 with moderate to severe subarticular
stenosis bilaterally
6. Right laminectomy L5-S1. Thickened nerve roots suggesting
arachnoiditis.

## 2021-01-17 DIAGNOSIS — M48062 Spinal stenosis, lumbar region with neurogenic claudication: Secondary | ICD-10-CM | POA: Diagnosis not present

## 2021-01-17 DIAGNOSIS — M5416 Radiculopathy, lumbar region: Secondary | ICD-10-CM | POA: Diagnosis not present

## 2021-01-17 DIAGNOSIS — M5136 Other intervertebral disc degeneration, lumbar region: Secondary | ICD-10-CM | POA: Diagnosis not present

## 2021-01-17 DIAGNOSIS — M5126 Other intervertebral disc displacement, lumbar region: Secondary | ICD-10-CM | POA: Diagnosis not present

## 2021-01-17 DIAGNOSIS — I10 Essential (primary) hypertension: Secondary | ICD-10-CM | POA: Diagnosis not present

## 2021-01-17 DIAGNOSIS — Z79899 Other long term (current) drug therapy: Secondary | ICD-10-CM | POA: Diagnosis not present

## 2021-07-13 DIAGNOSIS — D3131 Benign neoplasm of right choroid: Secondary | ICD-10-CM | POA: Diagnosis not present

## 2021-08-08 DIAGNOSIS — H2512 Age-related nuclear cataract, left eye: Secondary | ICD-10-CM | POA: Diagnosis not present

## 2021-08-09 ENCOUNTER — Encounter: Payer: Self-pay | Admitting: Ophthalmology

## 2021-08-21 NOTE — Discharge Instructions (Signed)

## 2021-08-22 ENCOUNTER — Ambulatory Visit: Payer: PPO | Admitting: Anesthesiology

## 2021-08-22 ENCOUNTER — Encounter
Admission: RE | Disposition: A | Discharge status: Home / Self Care | Payer: Self-pay | Source: Home / Self Care | Attending: Ophthalmology

## 2021-08-22 ENCOUNTER — Ambulatory Visit
Admission: RE | Admit: 2021-08-22 | Discharge: 2021-08-22 | Disposition: A | Discharge status: Home / Self Care | Payer: PPO | Attending: Ophthalmology | Admitting: Ophthalmology

## 2021-08-22 ENCOUNTER — Other Ambulatory Visit: Payer: Self-pay

## 2021-08-22 ENCOUNTER — Encounter: Payer: Self-pay | Admitting: Ophthalmology

## 2021-08-22 DIAGNOSIS — H2512 Age-related nuclear cataract, left eye: Secondary | ICD-10-CM | POA: Insufficient documentation

## 2021-08-22 DIAGNOSIS — F17210 Nicotine dependence, cigarettes, uncomplicated: Secondary | ICD-10-CM | POA: Diagnosis not present

## 2021-08-22 DIAGNOSIS — Z800 Family history of malignant neoplasm of digestive organs: Secondary | ICD-10-CM | POA: Insufficient documentation

## 2021-08-22 DIAGNOSIS — Z791 Long term (current) use of non-steroidal anti-inflammatories (NSAID): Secondary | ICD-10-CM | POA: Diagnosis not present

## 2021-08-22 DIAGNOSIS — H25812 Combined forms of age-related cataract, left eye: Secondary | ICD-10-CM | POA: Diagnosis not present

## 2021-08-22 DIAGNOSIS — Z801 Family history of malignant neoplasm of trachea, bronchus and lung: Secondary | ICD-10-CM | POA: Diagnosis not present

## 2021-08-22 HISTORY — PX: CATARACT EXTRACTION W/PHACO: SHX586

## 2021-08-22 HISTORY — DX: Presence of dental prosthetic device (complete) (partial): Z97.2

## 2021-08-22 SURGERY — PHACOEMULSIFICATION, CATARACT, WITH IOL INSERTION
Anesthesia: Monitor Anesthesia Care | Site: Eye | Laterality: Left

## 2021-08-22 MED ORDER — MOXIFLOXACIN HCL 0.5 % OP SOLN
OPHTHALMIC | Status: DC | PRN
  Administered 2021-08-22: 0.2 mL via OPHTHALMIC

## 2021-08-22 MED ORDER — BRIMONIDINE TARTRATE-TIMOLOL 0.2-0.5 % OP SOLN
OPHTHALMIC | Status: DC | PRN
  Administered 2021-08-22: 1 [drp] via OPHTHALMIC

## 2021-08-22 MED ORDER — ARMC OPHTHALMIC DILATING DROPS
1.0000 "application " | OPHTHALMIC | Status: DC | PRN
  Administered 2021-08-22 (×3): 1 via OPHTHALMIC

## 2021-08-22 MED ORDER — MIDAZOLAM HCL 2 MG/2ML IJ SOLN
INTRAMUSCULAR | Status: DC | PRN
  Administered 2021-08-22: 1.5 mg via INTRAVENOUS
  Administered 2021-08-22: .5 mg via INTRAVENOUS

## 2021-08-22 MED ORDER — OXYCODONE HCL 5 MG PO TABS: 5.0000 mg | ORAL_TABLET | Freq: Once | ORAL | Status: DC | PRN

## 2021-08-22 MED ORDER — OXYCODONE HCL 5 MG/5ML PO SOLN: 5.0000 mg | Freq: Once | ORAL | Status: DC | PRN

## 2021-08-22 MED ORDER — TRYPAN BLUE 0.06 % IO SOSY: PREFILLED_SYRINGE | INTRAOCULAR | Status: DC | PRN

## 2021-08-22 MED ORDER — LACTATED RINGERS IV SOLN: INTRAVENOUS | Status: DC

## 2021-08-22 MED ORDER — SIGHTPATH DOSE#1 BSS IO SOLN
INTRAOCULAR | Status: DC | PRN
  Administered 2021-08-22: 102 mL via OPHTHALMIC

## 2021-08-22 MED ORDER — SIGHTPATH DOSE#1 NA CHONDROIT SULF-NA HYALURON 40-17 MG/ML IO SOLN
INTRAOCULAR | Status: DC | PRN
  Administered 2021-08-22: 1 mL via INTRAOCULAR

## 2021-08-22 MED ORDER — SIGHTPATH DOSE#1 BSS IO SOLN
INTRAOCULAR | Status: DC | PRN
  Administered 2021-08-22: 15 mL

## 2021-08-22 MED ORDER — FENTANYL CITRATE (PF) 100 MCG/2ML IJ SOLN
INTRAMUSCULAR | Status: DC | PRN
  Administered 2021-08-22: 50 ug via INTRAVENOUS

## 2021-08-22 MED ORDER — SIGHTPATH DOSE#1 BSS IO SOLN
INTRAOCULAR | Status: DC | PRN
  Administered 2021-08-22: 1 mL

## 2021-08-22 MED ORDER — TETRACAINE HCL 0.5 % OP SOLN
1.0000 [drp] | OPHTHALMIC | Status: DC | PRN
  Administered 2021-08-22 (×3): 1 [drp] via OPHTHALMIC

## 2021-08-22 SURGICAL SUPPLY — 18 items
CANNULA ANT/CHMB 27GA (MISCELLANEOUS) IMPLANT
GLOVE SURG ENC TEXT LTX SZ8 (GLOVE) ×2 IMPLANT
GLOVE SURG TRIUMPH 8.0 PF LTX (GLOVE) ×2 IMPLANT
GOWN STRL REUS W/ TWL LRG LVL3 (GOWN DISPOSABLE) ×2 IMPLANT
GOWN STRL REUS W/TWL LRG LVL3 (GOWN DISPOSABLE) ×4
LENS IOL TECNIS EYHANCE 22.5 (Intraocular Lens) ×2 IMPLANT
MARKER SKIN DUAL TIP RULER LAB (MISCELLANEOUS) IMPLANT
NEEDLE FILTER BLUNT 18X 1/2SAF (NEEDLE) ×1
NEEDLE FILTER BLUNT 18X1 1/2 (NEEDLE) ×1 IMPLANT
PACK EYE AFTER SURG (MISCELLANEOUS) IMPLANT
RING MALYGIN (MISCELLANEOUS) IMPLANT
SUT ETHILON 10-0 CS-B-6CS-B-6 (SUTURE)
SUTURE EHLN 10-0 CS-B-6CS-B-6 (SUTURE) IMPLANT
SYR 3ML LL SCALE MARK (SYRINGE) ×2 IMPLANT
SYR TB 1ML LUER SLIP (SYRINGE) ×2 IMPLANT
TIP ITREPID SGL USE BENT I/A (SUCTIONS) ×2 IMPLANT
WATER STERILE IRR 250ML POUR (IV SOLUTION) ×2 IMPLANT
WIPE NON LINTING 3.25X3.25 (MISCELLANEOUS) IMPLANT
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
## 2021-08-22 NOTE — Transfer of Care (Signed)
Immediate Anesthesia Transfer of Care Note  Patient: Daniel Larson  Procedure(s) Performed: CATARACT EXTRACTION PHACO AND INTRAOCULAR LENS PLACEMENT (IOC) LEFT (Left: Eye)  Patient Location: PACU  Anesthesia Type: MAC  Level of Consciousness: awake, alert  and patient cooperative  Airway and Oxygen Therapy: Patient Spontanous Breathing and Patient connected to supplemental oxygen  Post-op Assessment: Post-op Vital signs reviewed, Patient's Cardiovascular Status Stable, Respiratory Function Stable, Patent Airway and No signs of Nausea or vomiting  Post-op Vital Signs: Reviewed and stable  Complications: No notable events documented.

## 2021-08-22 NOTE — Op Note (Signed)
PREOPERATIVE DIAGNOSIS:  Nuclear sclerotic cataract of the left eye.   POSTOPERATIVE DIAGNOSIS:  Nuclear sclerotic cataract of the left eye.   OPERATIVE PROCEDURE:ORPROCALL@   SURGEON:  Birder Robson, MD.   ANESTHESIA:  Anesthesiologist: Fidel Levy, MD CRNA: Mayme Genta, CRNA  1.      Managed anesthesia care. 2.     0.59ml of Shugarcaine was instilled following the paracentesis   COMPLICATIONS:  None.   TECHNIQUE:   Stop and chop   DESCRIPTION OF PROCEDURE:  The patient was examined and consented in the preoperative holding area where the aforementioned topical anesthesia was applied to the left eye and then brought back to the Operating Room where the left eye was prepped and draped in the usual sterile ophthalmic fashion and a lid speculum was placed. A paracentesis was created with the side port blade and the anterior chamber was filled with viscoelastic. A near clear corneal incision was performed with the steel keratome. A continuous curvilinear capsulorrhexis was performed with a cystotome followed by the capsulorrhexis forceps. Hydrodissection and hydrodelineation were carried out with BSS on a blunt cannula. The lens was removed in a stop and chop  technique and the remaining cortical material was removed with the irrigation-aspiration handpiece. The capsular bag was inflated with viscoelastic and the Technis ZCB00 lens was placed in the capsular bag without complication. The remaining viscoelastic was removed from the eye with the irrigation-aspiration handpiece. The wounds were hydrated. The anterior chamber was flushed with BSS and the eye was inflated to physiologic pressure. 0.35ml Vigamox was placed in the anterior chamber. The wounds were found to be water tight. The eye was dressed with Combigan. The patient was given protective glasses to wear throughout the day and a shield with which to sleep tonight. The patient was also given drops with which to begin a drop regimen  today and will follow-up with me in one day. Implant Name Type Inv. Item Serial No. Manufacturer Lot No. LRB No. Used Action  LENS IOL TECNIS EYHANCE 22.5 - [id number] Intraocular Lens LENS IOL TECNIS EYHANCE 22.5 [id number] JOHNSON   Left 1 Implanted    Procedure(s) with comments: CATARACT EXTRACTION PHACO AND INTRAOCULAR LENS PLACEMENT (IOC) LEFT (Left) - 6.22 0:46.2  Electronically signed: Birder Robson 08/22/2021 8:55 AM

## 2021-08-22 NOTE — Anesthesia Postprocedure Evaluation (Signed)
Anesthesia Post Note  Patient: Daniel Larson  Procedure(s) Performed: CATARACT EXTRACTION PHACO AND INTRAOCULAR LENS PLACEMENT (IOC) LEFT (Left: Eye)     Patient location during evaluation: PACU Anesthesia Type: MAC Level of consciousness: awake and alert Pain management: pain level controlled Vital Signs Assessment: post-procedure vital signs reviewed and stable Respiratory status: spontaneous breathing, nonlabored ventilation, respiratory function stable and patient connected to nasal cannula oxygen Cardiovascular status: stable and blood pressure returned to baseline Postop Assessment: no apparent nausea or vomiting Anesthetic complications: no   No notable events documented.  Fidel Levy
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
## 2021-08-22 NOTE — Anesthesia Procedure Notes (Signed)
Date/Time: 08/22/2021 8:37 AM Performed by: Mayme Genta, CRNA Pre-anesthesia Checklist: Patient identified, Emergency Drugs available, Suction available, Timeout performed and Patient being monitored Patient Re-evaluated:Patient Re-evaluated prior to induction Oxygen Delivery Method: Nasal cannula Placement Confirmation: positive ETCO2

## 2021-08-23 ENCOUNTER — Encounter: Payer: Self-pay | Admitting: Ophthalmology

## 2021-08-28 DIAGNOSIS — H2511 Age-related nuclear cataract, right eye: Secondary | ICD-10-CM | POA: Diagnosis not present

## 2021-09-04 NOTE — Discharge Instructions (Signed)

## 2021-09-05 ENCOUNTER — Ambulatory Visit: Payer: PPO | Admitting: Anesthesiology

## 2021-09-05 ENCOUNTER — Other Ambulatory Visit: Payer: Self-pay

## 2021-09-05 ENCOUNTER — Encounter: Payer: Self-pay | Admitting: Ophthalmology

## 2021-09-05 ENCOUNTER — Encounter
Admission: RE | Disposition: A | Discharge status: Home / Self Care | Payer: Self-pay | Source: Home / Self Care | Attending: Ophthalmology

## 2021-09-05 ENCOUNTER — Ambulatory Visit
Admission: RE | Admit: 2021-09-05 | Discharge: 2021-09-05 | Disposition: A | Discharge status: Home / Self Care | Payer: PPO | Attending: Ophthalmology | Admitting: Ophthalmology

## 2021-09-05 DIAGNOSIS — J449 Chronic obstructive pulmonary disease, unspecified: Secondary | ICD-10-CM | POA: Insufficient documentation

## 2021-09-05 DIAGNOSIS — F17210 Nicotine dependence, cigarettes, uncomplicated: Secondary | ICD-10-CM | POA: Insufficient documentation

## 2021-09-05 DIAGNOSIS — H25811 Combined forms of age-related cataract, right eye: Secondary | ICD-10-CM | POA: Diagnosis not present

## 2021-09-05 DIAGNOSIS — H2511 Age-related nuclear cataract, right eye: Secondary | ICD-10-CM | POA: Diagnosis not present

## 2021-09-05 HISTORY — PX: CATARACT EXTRACTION W/PHACO: SHX586

## 2021-09-05 SURGERY — PHACOEMULSIFICATION, CATARACT, WITH IOL INSERTION
Anesthesia: Monitor Anesthesia Care | Site: Eye | Laterality: Right

## 2021-09-05 MED ORDER — FENTANYL CITRATE (PF) 100 MCG/2ML IJ SOLN
INTRAMUSCULAR | Status: DC | PRN
  Administered 2021-09-05: 50 ug via INTRAVENOUS

## 2021-09-05 MED ORDER — SIGHTPATH DOSE#1 BSS IO SOLN
INTRAOCULAR | Status: DC | PRN
  Administered 2021-09-05: 15 mL

## 2021-09-05 MED ORDER — ACETAMINOPHEN 160 MG/5ML PO SOLN: 325.0000 mg | ORAL | Status: DC | PRN

## 2021-09-05 MED ORDER — MIDAZOLAM HCL 2 MG/2ML IJ SOLN
INTRAMUSCULAR | Status: DC | PRN
  Administered 2021-09-05 (×2): 1 mg via INTRAVENOUS

## 2021-09-05 MED ORDER — SIGHTPATH DOSE#1 BSS IO SOLN
INTRAOCULAR | Status: DC | PRN
  Administered 2021-09-05: 77 mL via OPHTHALMIC

## 2021-09-05 MED ORDER — LACTATED RINGERS IV SOLN: INTRAVENOUS | Status: DC

## 2021-09-05 MED ORDER — ACETAMINOPHEN 325 MG PO TABS: 325.0000 mg | ORAL_TABLET | ORAL | Status: DC | PRN

## 2021-09-05 MED ORDER — SIGHTPATH DOSE#1 BSS IO SOLN
INTRAOCULAR | Status: DC | PRN
  Administered 2021-09-05: 1 mL

## 2021-09-05 MED ORDER — ONDANSETRON HCL 4 MG/2ML IJ SOLN: 4.0000 mg | Freq: Once | INTRAMUSCULAR | Status: DC | PRN

## 2021-09-05 MED ORDER — TETRACAINE HCL 0.5 % OP SOLN
1.0000 [drp] | OPHTHALMIC | Status: DC | PRN
  Administered 2021-09-05 (×3): 1 [drp] via OPHTHALMIC

## 2021-09-05 MED ORDER — CEFUROXIME OPHTHALMIC INJECTION 1 MG/0.1 ML
INJECTION | OPHTHALMIC | Status: DC | PRN
  Administered 2021-09-05: 0.1 mL via INTRACAMERAL

## 2021-09-05 MED ORDER — ARMC OPHTHALMIC DILATING DROPS
1.0000 "application " | OPHTHALMIC | Status: DC | PRN
  Administered 2021-09-05 (×3): 1 via OPHTHALMIC

## 2021-09-05 MED ORDER — BRIMONIDINE TARTRATE-TIMOLOL 0.2-0.5 % OP SOLN
OPHTHALMIC | Status: DC | PRN
  Administered 2021-09-05: 1 [drp] via OPHTHALMIC

## 2021-09-05 MED ORDER — SIGHTPATH DOSE#1 NA CHONDROIT SULF-NA HYALURON 40-17 MG/ML IO SOLN
INTRAOCULAR | Status: DC | PRN
  Administered 2021-09-05: 1 mL via INTRAOCULAR

## 2021-09-05 SURGICAL SUPPLY — 17 items
CANNULA ANT/CHMB 27GA (MISCELLANEOUS) IMPLANT
GLOVE SURG ENC TEXT LTX SZ8 (GLOVE) ×3 IMPLANT
GLOVE SURG TRIUMPH 8.0 PF LTX (GLOVE) ×3 IMPLANT
GOWN STRL REUS W/ TWL LRG LVL3 (GOWN DISPOSABLE) ×2 IMPLANT
GOWN STRL REUS W/TWL LRG LVL3 (GOWN DISPOSABLE) ×6
LENS IOL TECNIS EYHANCE 22.5 (Intraocular Lens) ×3 IMPLANT
MARKER SKIN DUAL TIP RULER LAB (MISCELLANEOUS) IMPLANT
NEEDLE FILTER BLUNT 18X 1/2SAF (NEEDLE) ×2
NEEDLE FILTER BLUNT 18X1 1/2 (NEEDLE) ×1 IMPLANT
PACK EYE AFTER SURG (MISCELLANEOUS) IMPLANT
RING MALYGIN (MISCELLANEOUS) IMPLANT
SUT ETHILON 10-0 CS-B-6CS-B-6 (SUTURE)
SUTURE EHLN 10-0 CS-B-6CS-B-6 (SUTURE) IMPLANT
SYR 3ML LL SCALE MARK (SYRINGE) ×3 IMPLANT
SYR TB 1ML LUER SLIP (SYRINGE) ×3 IMPLANT
WATER STERILE IRR 250ML POUR (IV SOLUTION) ×3 IMPLANT
WIPE NON LINTING 3.25X3.25 (MISCELLANEOUS) IMPLANT

## 2021-09-05 NOTE — Transfer of Care (Signed)
Immediate Anesthesia Transfer of Care Note  Patient: Daniel Larson  Procedure(s) Performed: CATARACT EXTRACTION PHACO AND INTRAOCULAR LENS PLACEMENT (IOC) RIGHT (Right: Eye)  Patient Location: PACU  Anesthesia Type: MAC  Level of Consciousness: awake, alert  and patient cooperative  Airway and Oxygen Therapy: Patient Spontanous Breathing and Patient connected to supplemental oxygen  Post-op Assessment: Post-op Vital signs reviewed, Patient's Cardiovascular Status Stable, Respiratory Function Stable, Patent Airway and No signs of Nausea or vomiting  Post-op Vital Signs: Reviewed and stable  Complications: No notable events documented.

## 2021-09-05 NOTE — Op Note (Signed)
PREOPERATIVE DIAGNOSIS:  Nuclear sclerotic cataract of the right eye.   POSTOPERATIVE DIAGNOSIS:  Cataract   OPERATIVE PROCEDURE:ORPROCALL@   SURGEON:  Birder Robson, MD.   ANESTHESIA:  Anesthesiologist: Sinda Du, MD CRNA: Dionne Bucy, CRNA  1.      Managed anesthesia care. 2.      0.65ml of Shugarcaine was instilled in the eye following the paracentesis.   COMPLICATIONS:  None.   TECHNIQUE:   Stop and chop   DESCRIPTION OF PROCEDURE:  The patient was examined and consented in the preoperative holding area where the aforementioned topical anesthesia was applied to the right eye and then brought back to the Operating Room where the right eye was prepped and draped in the usual sterile ophthalmic fashion and a lid speculum was placed. A paracentesis was created with the side port blade and the anterior chamber was filled with viscoelastic. A near clear corneal incision was performed with the steel keratome. A continuous curvilinear capsulorrhexis was performed with a cystotome followed by the capsulorrhexis forceps. Hydrodissection and hydrodelineation were carried out with BSS on a blunt cannula. The lens was removed in a stop and chop  technique and the remaining cortical material was removed with the irrigation-aspiration handpiece. The capsular bag was inflated with viscoelastic and the Technis ZCB00  lens was placed in the capsular bag without complication. The remaining viscoelastic was removed from the eye with the irrigation-aspiration handpiece. The wounds were hydrated. The anterior chamber was flushed with BSS and the eye was inflated to physiologic pressure. 0.39ml of Vigamox was placed in the anterior chamber. The wounds were found to be water tight. The eye was dressed with Combigan. The patient was given protective glasses to wear throughout the day and a shield with which to sleep tonight. The patient was also given drops with which to begin a drop regimen today and will  follow-up with me in one day. Implant Name Type Inv. Item Serial No. Manufacturer Lot No. LRB No. Used Action  LENS IOL TECNIS EYHANCE 22.5 - [id number] Intraocular Lens LENS IOL TECNIS EYHANCE 22.5 [id number] [employer]ON   Right 1 Implanted   Procedure(s) with comments: CATARACT EXTRACTION PHACO AND INTRAOCULAR LENS PLACEMENT (IOC) RIGHT (Right) - 6.40 0:46.2  Electronically signed: Birder Robson 09/05/2021 10:57 AM
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
## 2021-09-05 NOTE — Anesthesia Procedure Notes (Signed)
Procedure Name: MAC Date/Time: 09/05/2021 10:39 AM Performed by: Dionne Bucy, CRNA Pre-anesthesia Checklist: Patient identified, Emergency Drugs available, Suction available, Patient being monitored and Timeout performed Patient Re-evaluated:Patient Re-evaluated prior to induction Oxygen Delivery Method: Nasal cannula Placement Confirmation: positive ETCO2

## 2021-09-05 NOTE — Anesthesia Postprocedure Evaluation (Signed)
Anesthesia Post Note  Patient: Daniel Larson  Procedure(s) Performed: CATARACT EXTRACTION PHACO AND INTRAOCULAR LENS PLACEMENT ([facility]) RIGHT (Right: Eye)     Patient location during evaluation: PACU Anesthesia Type: MAC Level of consciousness: awake and alert Pain management: pain level controlled Vital Signs Assessment: post-procedure vital signs reviewed and stable Respiratory status: spontaneous breathing, nonlabored ventilation, respiratory function stable and patient connected to nasal cannula oxygen Cardiovascular status: stable and blood pressure returned to baseline Postop Assessment: no apparent nausea or vomiting Anesthetic complications: no   No notable events documented.  Sinda Du

## 2021-09-06 ENCOUNTER — Encounter: Payer: Self-pay | Admitting: Ophthalmology

## 2024-01-01 DIAGNOSIS — I10 Essential (primary) hypertension: Secondary | ICD-10-CM | POA: Diagnosis not present

## 2024-01-01 DIAGNOSIS — Z125 Encounter for screening for malignant neoplasm of prostate: Secondary | ICD-10-CM | POA: Diagnosis not present

## 2024-01-08 DIAGNOSIS — Z0000 Encounter for general adult medical examination without abnormal findings: Secondary | ICD-10-CM | POA: Diagnosis not present

## 2024-01-08 DIAGNOSIS — Z0001 Encounter for general adult medical examination with abnormal findings: Secondary | ICD-10-CM | POA: Diagnosis not present

## 2024-01-08 DIAGNOSIS — J449 Chronic obstructive pulmonary disease, unspecified: Secondary | ICD-10-CM | POA: Diagnosis not present

## 2024-01-08 DIAGNOSIS — F17210 Nicotine dependence, cigarettes, uncomplicated: Secondary | ICD-10-CM | POA: Diagnosis not present

## 2024-01-08 DIAGNOSIS — I10 Essential (primary) hypertension: Secondary | ICD-10-CM | POA: Diagnosis not present

## 2024-01-17 DIAGNOSIS — Z1212 Encounter for screening for malignant neoplasm of rectum: Secondary | ICD-10-CM | POA: Diagnosis not present

## 2024-07-07 DIAGNOSIS — I10 Essential (primary) hypertension: Secondary | ICD-10-CM | POA: Diagnosis not present

## 2024-07-14 DIAGNOSIS — J449 Chronic obstructive pulmonary disease, unspecified: Secondary | ICD-10-CM | POA: Diagnosis not present

## 2024-07-14 DIAGNOSIS — I10 Essential (primary) hypertension: Secondary | ICD-10-CM | POA: Diagnosis not present

## 2024-07-14 DIAGNOSIS — M5136 Other intervertebral disc degeneration, lumbar region with discogenic back pain only: Secondary | ICD-10-CM | POA: Diagnosis not present

## 2024-07-14 DIAGNOSIS — Z125 Encounter for screening for malignant neoplasm of prostate: Secondary | ICD-10-CM | POA: Diagnosis not present

## 2024-07-14 DIAGNOSIS — Z720 Tobacco use: Secondary | ICD-10-CM | POA: Diagnosis not present
